# Patient Record
Sex: Male | Born: 2000 | Hispanic: No | Marital: Single
Health system: Southern US, Community
[De-identification: ages and names within clinical notes are randomized; demographics above are authoritative.]

---
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
## 2024-09-01 ENCOUNTER — Encounter (HOSPITAL_BASED_OUTPATIENT_CLINIC_OR_DEPARTMENT_OTHER): Payer: Self-pay | Admitting: Emergency Medicine

## 2024-09-01 ENCOUNTER — Emergency Department (HOSPITAL_BASED_OUTPATIENT_CLINIC_OR_DEPARTMENT_OTHER)
Admission: EM | Admit: 2024-09-01 | Discharge: 2024-09-01 | Disposition: A | Discharge status: Home / Self Care | Attending: Emergency Medicine | Admitting: Emergency Medicine

## 2024-09-01 ENCOUNTER — Emergency Department (HOSPITAL_BASED_OUTPATIENT_CLINIC_OR_DEPARTMENT_OTHER)

## 2024-09-01 DIAGNOSIS — R059 Cough, unspecified: Secondary | ICD-10-CM | POA: Diagnosis present

## 2024-09-01 DIAGNOSIS — J069 Acute upper respiratory infection, unspecified: Secondary | ICD-10-CM | POA: Insufficient documentation

## 2024-09-01 LAB — RESP PANEL BY RT-PCR (RSV, FLU A&B, COVID)  RVPGX2
Influenza A by PCR: NEGATIVE
Influenza B by PCR: NEGATIVE
Resp Syncytial Virus by PCR: NEGATIVE
SARS Coronavirus 2 by RT PCR: NEGATIVE

## 2024-09-01 LAB — GROUP A STREP BY PCR: Group A Strep by PCR: NOT DETECTED

## 2024-09-01 MED ORDER — IBUPROFEN 800 MG PO TABS
800.0000 mg | ORAL_TABLET | Freq: Once | ORAL | Status: AC
  Administered 2024-09-01: 800 mg via ORAL
  Filled 2024-09-01: qty 1

## 2024-09-01 MED ORDER — ACETAMINOPHEN 500 MG PO TABS: 1000.0000 mg | ORAL_TABLET | Freq: Once | ORAL | Status: DC

## 2024-09-01 NOTE — Discharge Instructions (Addendum)
 You were evaluated in the emergency room for fever .You may use Tylenol 1000 mg and/or Motrin 600 mg every 4-6 hours up to 3 times a day for fever or body aches.  Please keep in mind that this dosing is not meant to be continued long-term and that many over-the-counter cough and flu medications contain acetaminophen or ibuprofen. You can expect your current symptoms to linger over the next week or two but please return to the emergency room if you experience any new or worsening symptoms including persistent fevers, worsening productive cough and persistent vomiting. Please follow-up with your primary care provider regarding your ER visit.

## 2024-09-01 NOTE — ED Triage Notes (Signed)
 Pt c/o cough, sore throat and not feeling well x 2 wks; fever x 3d; OTC meds not helping; had tylenol 3 hrs ago

## 2024-09-01 NOTE — ED Provider Notes (Signed)
 Warren Villa Provider Note   CSN: [id number] Arrival date & time: 09/01/24  2031     Patient presents with: Cough   Warren Villa is a 23 y.o. male otherwise healthy presents with complaints of 2 weeks of URI symptoms.  Reports that he has had subjective fevers for the past 3 nights.  Reports productive cough, nasal congestion and sore throat.  Describes pain with swallowing.  No difficulty or difficulty breathing.  Denies any recent sick contacts.  Endorses nausea without vomiting.  No abdominal pain, chest pain or shortness of breath.    Cough History reviewed. No pertinent past medical history. History reviewed. No pertinent surgical history.      Prior to Admission medications   Not on File    Allergies: Patient has no known allergies.    Review of Systems  Respiratory:  Positive for cough.     Updated Vital Signs BP 110/72   Pulse 100   Temp 100 F (37.8 C) (Oral)   Resp 18   Ht 5' 9 (1.753 m)   Wt 81.6 kg   SpO2 93%   BMI 26.58 kg/m   Physical Exam Vitals and nursing note reviewed.  Constitutional:      General: He is not in acute distress.    Appearance: He is well-developed.  HENT:     Head: Normocephalic and atraumatic.     Mouth/Throat:     Comments: Uvula is midline, no trismus or pooling of secretions, no abnormal phonation.  There is no submandibular or facial swelling, no sublingual elevation.  No stridor.  Speaks in full sentences Eyes:     Conjunctiva/sclera: Conjunctivae normal.  Cardiovascular:     Rate and Rhythm: Normal rate and regular rhythm.     Heart sounds: No murmur heard. Pulmonary:     Effort: Pulmonary effort is normal. No respiratory distress.     Breath sounds: Normal breath sounds.  Abdominal:     Palpations: Abdomen is soft.     Tenderness: There is no abdominal tenderness.  Musculoskeletal:        General: No swelling.     Cervical back: Neck supple.  Skin:     General: Skin is warm and dry.     Capillary Refill: Capillary refill takes less than 2 seconds.  Neurological:     Mental Status: He is alert.  Psychiatric:        Mood and Affect: Mood normal.     (all labs ordered are listed, but only abnormal results are displayed) Labs Reviewed  RESP PANEL BY RT-PCR (RSV, FLU A&B, COVID)  [id number]  GROUP A STREP BY PCR    EKG: None  Radiology: DG Chest 2 View Result Date: 09/01/2024 EXAM: 2 VIEW(S) XRAY OF THE CHEST 09/01/2024 09:16:00 PM COMPARISON: None available. CLINICAL HISTORY: Cough. FINDINGS: LUNGS AND PLEURA: No focal pulmonary opacity. No pulmonary edema. No pleural effusion. No pneumothorax. HEART AND MEDIASTINUM: No acute abnormality of the cardiac and mediastinal silhouettes. BONES AND SOFT TISSUES: No acute osseous abnormality. IMPRESSION: 1. No acute process. Electronically signed by: Franky Crease MD 09/01/2024 09:28 PM EDT RP Workstation: [id number]     Procedures   Medications Ordered in the ED  ibuprofen (ADVIL) tablet 800 mg (800 mg Oral Given 09/01/24 2107)    Clinical Course as of 09/01/24 2158  Sat Sep 01, 2024  2104 Patient evaluated for 2 weeks of URI symptoms.  Upon arrival he is febrile and appropriately  tachycardic.  His lung sounds are clear.  His abdomen is nontender.  He has no tonsillar exudate.  He is without trismus or abnormal phonation.  Will obtain respiratory swabs and given 2 weeks of worsening symptoms recurrent fever will obtain chest x-ray. [JT]  2117 Group A Strep by PCR Negative [JT]  2144 Resp panel by RT-PCR (RSV, Flu A&B, Covid) Anterior Nasal Swab Negative [JT]  2144 DG Chest 2 View No acute process [JT]  2144 Workup overall reassuring.  Etiology likely viral and is expected to resolve.  Recommended supportive care.  Patient be discharged home.  His fever and tachycardia have improved appropriately at time of discharge.  Encouraged to follow-up with PCP.  Strict return precautions provided.   Patient is understanding and in agreement with plan. [JT]    Clinical Course User Index [JT] Warren Lynwood Villa, Warren Villa                                 Medical Decision Making  This patient presents to the ED with chief complaint(s) of cough .  The complaint involves an extensive differential diagnosis and also carries with it a high risk of complications and morbidity.   Pertinent past medical history as listed in HPI  The differential diagnosis includes  Exam and history not consistent with Ludwig's angina or deep space infection such as retropharyngeal abscess or peritonsillar abscess.  Considered pneumonia, however patient's lung sounds are clear and he has no consolidation on x-ray. Additional history obtained: Records reviewed Care Everywhere/External Records  Disposition:   Patient will be discharged home. The patient has been appropriately medically screened and/or stabilized in the ED. I have low suspicion for any other emergent medical condition which would require further screening, evaluation or treatment in the ED or require inpatient management. At time of discharge the patient is hemodynamically stable and in no acute distress. I have discussed work-up results and diagnosis with patient and answered all questions. Patient is agreeable with discharge plan. We discussed strict return precautions for returning to the emergency department and they verbalized understanding.     Social Determinants of Health:   none  This note was dictated with voice recognition software.  Despite best efforts at proofreading, errors may have occurred which can change the documentation meaning.       Final diagnoses:  Viral URI with cough    ED Discharge Orders     None          Warren Lynwood Villa, Warren Villa 09/01/24 2158    Warren Ozell LABOR, Warren Villa 09/04/24 0102

## 2024-09-01 NOTE — ED Notes (Signed)
 ED Provider at bedside.

## 2024-09-01 NOTE — ED Notes (Signed)
 Patient transported to X-ray
